# Patient Record
Sex: Female | Born: 1955 | Race: White | Hispanic: No | Marital: Married | Smoking: Never smoker
Health system: Southern US, Community
[De-identification: ages and names within clinical notes are randomized; demographics above are authoritative.]

## PROBLEM LIST (undated history)

## (undated) DIAGNOSIS — M1990 Unspecified osteoarthritis, unspecified site: Secondary | ICD-10-CM

## (undated) DIAGNOSIS — E119 Type 2 diabetes mellitus without complications: Secondary | ICD-10-CM

## (undated) DIAGNOSIS — E079 Disorder of thyroid, unspecified: Secondary | ICD-10-CM

## (undated) DIAGNOSIS — I10 Essential (primary) hypertension: Secondary | ICD-10-CM

## (undated) DIAGNOSIS — E7800 Pure hypercholesterolemia, unspecified: Secondary | ICD-10-CM

---

## 2015-07-02 ENCOUNTER — Emergency Department
Admission: EM | Admit: 2015-07-02 | Discharge: 2015-07-02 | Disposition: A | Discharge status: Home / Self Care | Payer: BC Managed Care – PPO | Attending: Emergency Medicine | Admitting: Emergency Medicine

## 2015-07-02 ENCOUNTER — Encounter: Payer: Self-pay | Admitting: Emergency Medicine

## 2015-07-02 ENCOUNTER — Emergency Department: Payer: BC Managed Care – PPO

## 2015-07-02 DIAGNOSIS — Y9289 Other specified places as the place of occurrence of the external cause: Secondary | ICD-10-CM | POA: Insufficient documentation

## 2015-07-02 DIAGNOSIS — G8929 Other chronic pain: Secondary | ICD-10-CM | POA: Insufficient documentation

## 2015-07-02 DIAGNOSIS — S8002XA Contusion of left knee, initial encounter: Secondary | ICD-10-CM | POA: Diagnosis not present

## 2015-07-02 DIAGNOSIS — Y9389 Activity, other specified: Secondary | ICD-10-CM | POA: Diagnosis not present

## 2015-07-02 DIAGNOSIS — S0990XA Unspecified injury of head, initial encounter: Secondary | ICD-10-CM | POA: Diagnosis not present

## 2015-07-02 DIAGNOSIS — E119 Type 2 diabetes mellitus without complications: Secondary | ICD-10-CM | POA: Diagnosis not present

## 2015-07-02 DIAGNOSIS — I10 Essential (primary) hypertension: Secondary | ICD-10-CM | POA: Insufficient documentation

## 2015-07-02 DIAGNOSIS — Y998 Other external cause status: Secondary | ICD-10-CM | POA: Diagnosis not present

## 2015-07-02 DIAGNOSIS — Z7982 Long term (current) use of aspirin: Secondary | ICD-10-CM | POA: Diagnosis not present

## 2015-07-02 DIAGNOSIS — Z79899 Other long term (current) drug therapy: Secondary | ICD-10-CM | POA: Diagnosis not present

## 2015-07-02 DIAGNOSIS — S80212A Abrasion, left knee, initial encounter: Secondary | ICD-10-CM | POA: Diagnosis not present

## 2015-07-02 DIAGNOSIS — T148XXA Other injury of unspecified body region, initial encounter: Secondary | ICD-10-CM

## 2015-07-02 DIAGNOSIS — Z7984 Long term (current) use of oral hypoglycemic drugs: Secondary | ICD-10-CM | POA: Diagnosis not present

## 2015-07-02 DIAGNOSIS — W01198A Fall on same level from slipping, tripping and stumbling with subsequent striking against other object, initial encounter: Secondary | ICD-10-CM | POA: Insufficient documentation

## 2015-07-02 HISTORY — DX: Type 2 diabetes mellitus without complications: E11.9

## 2015-07-02 HISTORY — DX: Pure hypercholesterolemia, unspecified: E78.00

## 2015-07-02 HISTORY — DX: Disorder of thyroid, unspecified: E07.9

## 2015-07-02 HISTORY — DX: Unspecified osteoarthritis, unspecified site: M19.90

## 2015-07-02 HISTORY — DX: Essential (primary) hypertension: I10

## 2015-07-02 NOTE — ED Notes (Signed)
States she tripped at the Vet's office   Hit head  And landed on left knee  Abrasion noted to left knee denies any LOC

## 2015-07-02 NOTE — Discharge Instructions (Signed)
Contusion A contusion is a deep bruise. Contusions are the result of a blunt injury to tissues and muscle fibers under the skin. The injury causes bleeding under the skin. The skin overlying the contusion may turn blue, purple, or yellow. Minor injuries will give you a painless contusion, but more severe contusions may stay painful and swollen for a few weeks.  CAUSES  This condition is usually caused by a blow, trauma, or direct force to an area of the body. SYMPTOMS  Symptoms of this condition include:  Swelling of the injured area.  Pain and tenderness in the injured area.  Discoloration. The area may have redness and then turn blue, purple, or yellow. DIAGNOSIS  This condition is diagnosed based on a physical exam and medical history. An X-ray, CT scan, or MRI may be needed to determine if there are any associated injuries, such as broken bones (fractures). TREATMENT  Specific treatment for this condition depends on what area of the body was injured. In general, the best treatment for a contusion is resting, icing, applying pressure to (compression), and elevating the injured area. This is often called the RICE strategy. Over-the-counter anti-inflammatory medicines may also be recommended for pain control.  HOME CARE INSTRUCTIONS   Rest the injured area.  If directed, apply ice to the injured area:  Put ice in a plastic bag.  Place a towel between your skin and the bag.  Leave the ice on for 20 minutes, 2-3 times per day.  If directed, apply light compression to the injured area using an elastic bandage. Make sure the bandage is not wrapped too tightly. Remove and reapply the bandage as directed by your health care provider.  If possible, raise (elevate) the injured area above the level of your heart while you are sitting or lying down.  Take over-the-counter and prescription medicines only as told by your health care provider. SEEK MEDICAL CARE IF:  Your symptoms do not  improve after several days of treatment.  Your symptoms get worse.  You have difficulty moving the injured area. SEEK IMMEDIATE MEDICAL CARE IF:   You have severe pain.  You have numbness in a hand or foot.  Your hand or foot turns pale or cold.   This information is not intended to replace advice given to you by your health care provider. Make sure you discuss any questions you have with your health care provider.   Document Released: 05/25/2005 Document Revised: 05/06/2015 Document Reviewed: 12/31/2014 Elsevier Interactive Patient Education 2016 [employer].  Concussion, Adult A concussion is a brain injury. It is caused by:  A hit to the head.  A quick and sudden movement (jolt) of the head or neck. A concussion is usually not life threatening. Even so, it can cause serious problems. If you had a concussion before, you may have concussion-like problems after a hit to your head. HOME CARE General Instructions  Follow your doctor's directions carefully.  Take medicines only as told by your doctor.  Only take medicines your doctor says are safe.  Do not drink alcohol until your doctor says it is okay. Alcohol and some drugs can slow down healing. They can also put you at risk for further injury.  If you are having trouble remembering things, write them down.  Try to do one thing at a time if you get distracted easily. For example, do not watch TV while making dinner.  Talk to your family members or close friends when making important decisions.  Follow up with your doctor as told.  Watch your symptoms. Tell others to do the same. Serious problems can sometimes happen after a concussion. Older adults are more likely to have these problems.  Tell your teachers, school nurse, school counselor, coach, Event organiser, or work Production designer, theatre/television/film about your concussion. Tell them about what you can or cannot do. They should watch to see if:  It gets even harder for you to pay  attention or concentrate.  It gets even harder for you to remember things or learn new things.  You need more time than normal to finish things.  You become annoyed (irritable) more than before.  You are not able to deal with stress as well.  You have more problems than before.  Rest. Make sure you:  Get plenty of sleep at night.  Go to sleep early.  Go to bed at the same time every day. Try to wake up at the same time.  Rest during the day.  Take naps when you feel tired.  Limit activities where you have to think a lot or concentrate. These include:  Doing homework.  Doing work related to a job.  Watching TV.  Using the computer. Returning To Your Regular Activities Return to your normal activities slowly, not all at once. You must give your body and brain enough time to heal.   Do not play sports or do other athletic activities until your doctor says it is okay.  Ask your doctor when you can drive, ride a bicycle, or work other vehicles or machines. Never do these things if you feel dizzy.  Ask your doctor about when you can return to work or school. Preventing Another Concussion It is very important to avoid another brain injury, especially before you have healed. In rare cases, another injury can lead to permanent brain damage, brain swelling, or death. The risk of this is greatest during the first 7-10 days after your injury. Avoid injuries by:   Wearing a seat belt when riding in a car.  Not drinking too much alcohol.  Avoiding activities that could lead to a second concussion (such as contact sports).  Wearing a helmet when doing activities like:  Biking.  Skiing.  Skateboarding.  Skating.  Making your home safer by:  Removing things from the floor or stairways that could make you trip.  Using grab bars in bathrooms and handrails by stairs.  Placing non-slip mats on floors and in bathtubs.  Improve lighting in dark areas. GET HELP IF:  It  gets even harder for you to pay attention or concentrate.  It gets even harder for you to remember things or learn new things.  You need more time than normal to finish things.  You become annoyed (irritable) more than before.  You are not able to deal with stress as well.  You have more problems than before.  You have problems keeping your balance.  You are not able to react quickly when you should. Get help if you have any of these problems for more than 2 weeks:   Lasting (chronic) headaches.  Dizziness or trouble balancing.  Feeling sick to your stomach (nausea).  Seeing (vision) problems.  Being affected by noises or light more than normal.  Feeling sad, low, down in the dumps, blue, gloomy, or empty (depressed).  Mood changes (mood swings).  Feeling of fear or nervousness about what may happen (anxiety).  Feeling annoyed.  Memory problems.  Problems concentrating or paying attention.  Sleep problems.  Feeling tired all the time. GET HELP RIGHT AWAY IF:   You have bad headaches or your headaches get worse.  You have weakness (even if it is in one hand, leg, or part of the face).  You have loss of feeling (numbness).  You feel off balance.  You keep throwing up (vomiting).  You feel tired.  One black center of your eye (pupil) is larger than the other.  You twitch or shake violently (convulse).  Your speech is not clear (slurred).  You are more confused, easily angered (agitated), or annoyed than before.  You have more trouble resting than before.  You are unable to recognize people or places.  You have neck pain.  It is difficult to wake you up.  You have unusual behavior changes.  You pass out (lose consciousness). MAKE SURE YOU:   Understand these instructions.  Will watch your condition.  Will get help right away if you are not doing well or get worse.   This information is not intended to replace advice given to you by your  health care provider. Make sure you discuss any questions you have with your health care provider.   Document Released: 08/03/2009 Document Revised: 09/05/2014 Document Reviewed: 03/07/2013 Elsevier Interactive Patient Education 2016 [employer]  Head Injury, Adult You have a head injury. Headaches and throwing up (vomiting) are common after a head injury. It should be easy to wake up from sleeping. Sometimes you must stay in the hospital. Most problems happen within the first 24 hours. Side effects may occur up to 7-10 days after the injury.  WHAT ARE THE TYPES OF HEAD INJURIES? Head injuries can be as minor as a bump. Some head injuries can be more severe. More severe head injuries include:  A jarring injury to the brain (concussion).  A bruise of the brain (contusion). This mean there is bleeding in the brain that can cause swelling.  A cracked skull (skull fracture).  Bleeding in the brain that collects, clots, and forms a bump (hematoma). WHEN SHOULD I GET HELP RIGHT AWAY?   You are confused or sleepy.  You cannot be woken up.  You feel sick to your stomach (nauseous) or keep throwing up (vomiting).  Your dizziness or unsteadiness is getting worse.  You have very bad, lasting headaches that are not helped by medicine. Take medicines only as told by your doctor.  You cannot use your arms or legs like normal.  You cannot walk.  You notice changes in the black spots in the center of the colored part of your eye (pupil).  You have clear or bloody fluid coming from your nose or ears.  You have trouble seeing. During the next 24 hours after the injury, you must stay with someone who can watch you. This person should get help right away (call 911 in the [place] if you start to shake and are not able to control it (have seizures), you pass out, or you are unable to wake up. HOW CAN I PREVENT A HEAD INJURY IN THE FUTURE?  Wear seat belts.  Wear a helmet while bike riding and  playing sports like football.  Stay away from dangerous activities around the house. WHEN CAN I RETURN TO NORMAL ACTIVITIES AND ATHLETICS? See your doctor before doing these activities. You should not do normal activities or play contact sports until 1 week after the following symptoms have stopped:  Headache that does not go away.  Dizziness.  Poor  attention.  Confusion.  Memory problems.  Sickness to your stomach or throwing up.  Tiredness.  Fussiness.  Bothered by bright lights or loud noises.  Anxiousness or depression.  Restless sleep. MAKE SURE YOU:   Understand these instructions.  Will watch your condition.  Will get help right away if you are not doing well or get worse.   This information is not intended to replace advice given to you by your health care provider. Make sure you discuss any questions you have with your health care provider.   Document Released: 07/28/2008 Document Revised: 09/05/2014 Document Reviewed: 04/22/2013 Elsevier Interactive Patient Education [employer]   He may use tramadol as needed for pain. Continue ice to the left knee and scalp. Follow-up with a local physician as needed. Return to the emergency room for any concerns. You  may also see an orthopedist for further knee concerns.

## 2015-07-02 NOTE — ED Notes (Signed)
Brought in via ems s/p fall hit head and has a abrasion to left knee

## 2015-07-02 NOTE — ED Provider Notes (Signed)
[facility] Emergency Department Provider Note  ____________________________________________  Time seen: Approximately 5:34 PM  I have reviewed the triage vital signs and the nursing notes.   HISTORY  Chief Complaint Fall    HPI Tamara Bentley is a 59 y.o. female with history of hypertension, diabetes him a hyperlipidemia, hypothyroidism who reports tripping on a step and falling while at the veterinarian today. She hit her knee on the pavement and her head on the building. She denies loss of consciousness, altered mental status or dizziness. She denies vision changes. She was evaluated by EMS and brought to the emergency room. She has pain to the left knee and an abrasion. She has a history of arthritis of the knees. She denies neck pain shoulder pain or injury to the arms. No additional injuries to the back or pelvis.   Past Medical History  Diagnosis Date  . Diabetes mellitus without complication (HCC)   . Hypertension   . Thyroid disease   . Arthritis   . Hypercholesteremia     There are no active problems to display for this patient.   No past surgical history on file.  Current Outpatient Rx  Name  Route  Sig  Dispense  Refill  . aspirin EC 81 MG tablet   Oral   Take 81 mg by mouth daily.         Marland Kitchen atorvastatin (LIPITOR) 80 MG tablet   Oral   Take 80 mg by mouth daily.         Marland Kitchen levothyroxine (SYNTHROID, LEVOTHROID) 150 MCG tablet   Oral   Take 150 mcg by mouth daily before breakfast.         . lisinopril (PRINIVIL,ZESTRIL) 40 MG tablet   Oral   Take 40 mg by mouth daily.         . metFORMIN (GLUCOPHAGE) 1000 MG tablet   Oral   Take 1,000 mg by mouth 2 (two) times daily with a meal.         . metoprolol tartrate (LOPRESSOR) 25 MG tablet   Oral   Take 25 mg by mouth 2 (two) times daily.         . sitaGLIPtin (JANUVIA) 100 MG tablet   Oral   Take 100 mg by mouth daily.           Allergies Review of patient's  allergies indicates no known allergies.  No family history on file.  Social History Social History  Substance Use Topics  . Smoking status: Never Smoker   . Smokeless tobacco: Not on file  . Alcohol Use: No    Review of Systems Constitutional: No fever/chills Eyes: No visual changes. ENT: No sore throat. Cardiovascular: Denies chest pain. Respiratory: Denies shortness of breath. Gastrointestinal: No abdominal pain.   Musculoskeletal: hx of chronic low back pain. Skin: Negative for rash. Neurological: Negative for focal weakness or numbness.  10-point ROS otherwise negative.  ____________________________________________   PHYSICAL EXAM:  VITAL SIGNS: ED Triage Vitals  Enc Vitals Group     BP --      Pulse --      Resp --      Temp --      Temp src --      SpO2 --      Weight --      Height --      Head Cir --      Peak Flow --      Pain Score --  Pain Loc --      Pain Edu? --      Excl. in GC? --     Constitutional: Alert and oriented. Well appearing and in no acute distress. Eyes: Conjunctivae are normal. PERRL. EOMI. Head: Atraumatic. Nose: No congestion/rhinnorhea. Mouth/Throat: Mucous membranes are moist.  Oropharynx non-erythematous. Neck: No stridor. Supple.  No cervical spine tenderness to palpation.  Cardiovascular: Normal rate, regular rhythm. Grossly normal heart sounds.  Good peripheral circulation. Respiratory: Normal respiratory effort.  No retractions. Lungs CTAB. Gastrointestinal: Soft and nontender. No distention. No abdominal bruits. No CVA tenderness. Musculoskeletal: abrasion to the left anterior knee;  Pain with mild palpation.  ROM intact. Minimal joint line tenderness.  Stable joint without laxity. Neurologic:  Normal speech and language. No gross focal neurologic deficits are appreciated. No gait instability. Cranial nerves II through XII grossly intact. Negative Romberg, normal cerebellar function Skin:  Skin is warm, dry and  intact. No rash noted. Psychiatric: Mood and affect are normal. Speech and behavior are normal.  ____________________________________________   LABS (all labs ordered are listed, but only abnormal results are displayed)  Labs Reviewed - No data to display ____________________________________________  EKG   ____________________________________________  RADIOLOGY      CLINICAL DATA: Patient fell at 4:30 this afternoon. Struck head on brick side of building before landing on the left knee. Pain in anterior and lateral left knee joint.  EXAM: LEFT KNEE - COMPLETE 4+ VIEW  COMPARISON: None.  FINDINGS: Mild degenerative changes in the left knee with mild medial compartment narrowing and small tricompartment osteophytosis. No significant effusion. No evidence of acute fracture or subluxation. No focal bone lesion or bone destruction. Bone cortex and trabecular architecture appear intact. No radiopaque soft tissue foreign bodies.  IMPRESSION: Mild degenerative changes of the left knee. No acute bony abnormalities.   Electronically Signed  By: Burman Nieves M.D.  On: 07/02/2015 18:33   CLINICAL DATA: States she tripped at the Vet's office Hit head And landed on left knee Abrasion noted to left knee denies any LOC  EXAM: CT HEAD WITHOUT CONTRAST  TECHNIQUE: Contiguous axial images were obtained from the base of the skull through the vertex without intravenous contrast.  COMPARISON: None.  FINDINGS: There is no intra or extra-axial fluid collection or mass lesion. The basilar cisterns and ventricles have a normal appearance. There is no CT evidence for acute infarction or hemorrhage. Bone windows demonstrate hyperostosis frontalis. Paranasal and mastoid air cells are normally aerated. No calvarial fracture.  IMPRESSION: No evidence for acute intracranial abnormality.   Electronically Signed  By: Norva Pavlov M.D.  On:  07/02/2015 18:33    ____________________________________________   PROCEDURES  Procedure(s) performed: None  Critical Care performed: No  ____________________________________________   INITIAL IMPRESSION / ASSESSMENT AND PLAN / ED COURSE  Pertinent labs & imaging results that were available during my care of the patient were reviewed by me and considered in my medical decision making (see chart for details).  59 year old female who suffered a fall hitting her left knee and left scalp. Normal CT of the head and x-ray of the left knee. She is treated for contusions and encouraged to follow-up with a local physician. She is given the name of a orthopedist if needed. She has tramadol as needed for pain. She will return to the emergency room for any concerns. ____________________________________________   FINAL CLINICAL IMPRESSION(S) / ED DIAGNOSES  Final diagnoses:  Head injury, initial encounter  Knee contusion, left, initial encounter  Abrasion  Ignacia Bayleyobert Stpehen Petitjean, PA-C 07/02/15 1845  Governor Rooksebecca Lord, MD 07/03/15 [id number]

## 2016-05-03 ENCOUNTER — Ambulatory Visit: Payer: BC Managed Care – PPO | Admitting: Internal Medicine

## 2016-05-03 ENCOUNTER — Telehealth: Payer: Self-pay | Admitting: *Deleted

## 2016-05-03 DIAGNOSIS — Z0289 Encounter for other administrative examinations: Secondary | ICD-10-CM

## 2016-05-03 NOTE — Telephone Encounter (Signed)
Patient did not come in for their appointment today for ingrown toenail Please let me know if patient needs to be contacted immediately for follow up or no follow up needed.

## 2016-05-03 NOTE — Telephone Encounter (Signed)
No follow up appt. needed

## 2016-05-25 ENCOUNTER — Ambulatory Visit: Payer: BC Managed Care – PPO | Admitting: Internal Medicine

## 2016-07-29 ENCOUNTER — Ambulatory Visit: Payer: BC Managed Care – PPO | Admitting: Dietician

## 2016-08-01 ENCOUNTER — Encounter: Payer: Self-pay | Admitting: Dietician

## 2017-01-29 IMAGING — CT CT HEAD W/O CM
3 series · 17 of 30 positions shown, 19 images · non-contrast
Comparison: None.

CLINICAL DATA: States she tripped at the Vet's office Hit head And
landed on left knee Abrasion noted to left knee denies any LOC

EXAM:
CT HEAD WITHOUT CONTRAST
TECHNIQUE: Contiguous axial images were obtained from the base of the skull
through the vertex without intravenous contrast.

[Series 2: head bone · axial · 0.43mm/px · z∈[+272,+418]mm · 8 of 85 slices shown]
[im 6/85  bone]
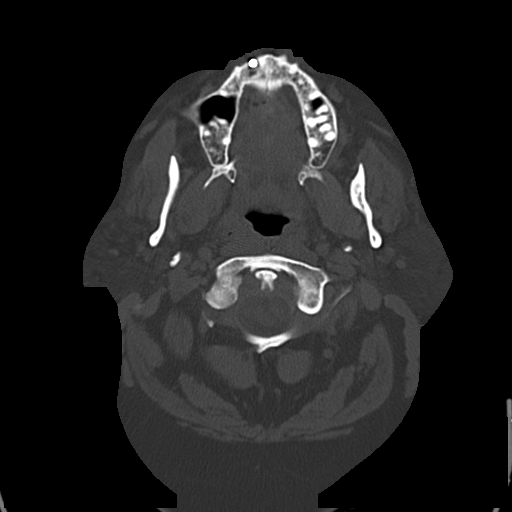
[im 16/85  bone]
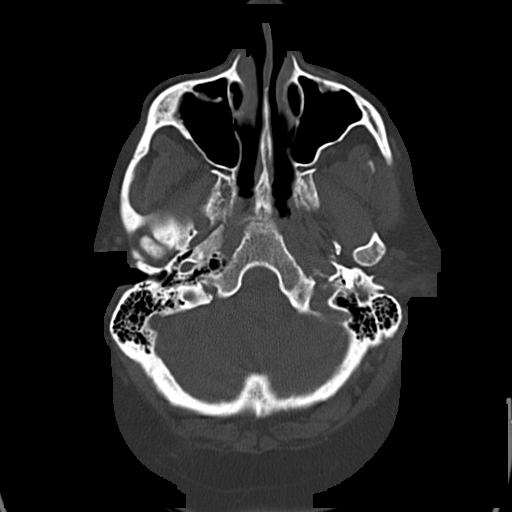
[im 27/85  bone]
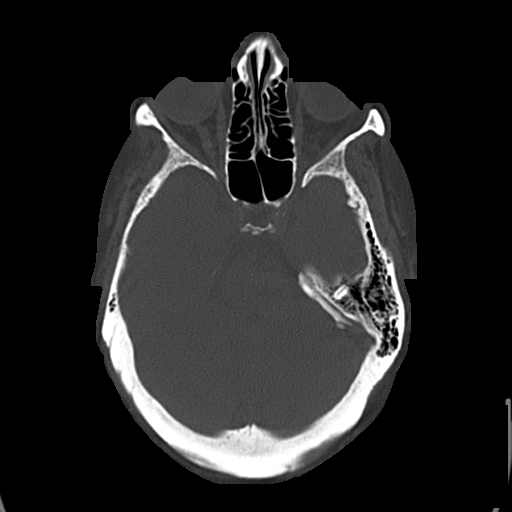
[im 37/85  bone]
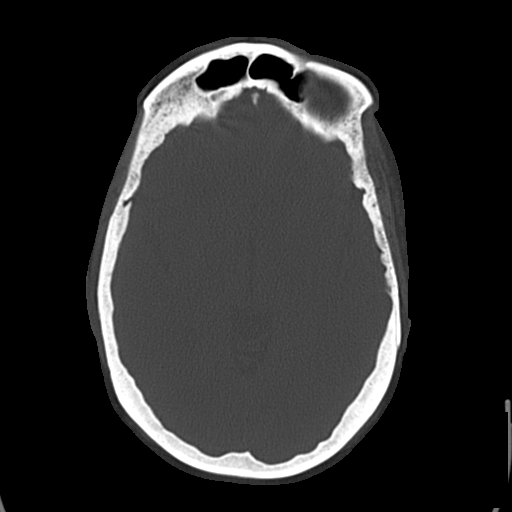
[im 48/85  bone]
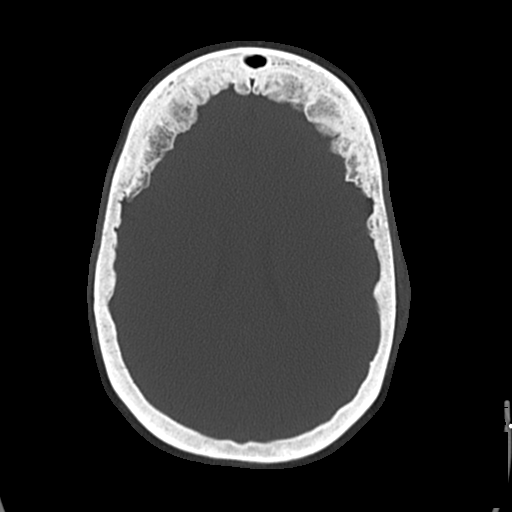
[im 58/85  bone]
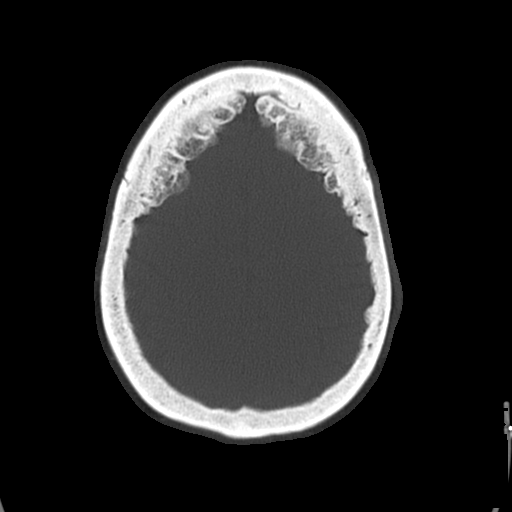
[im 69/85  bone]
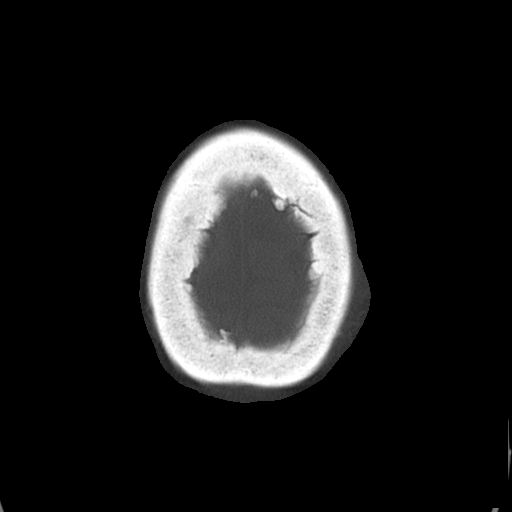
[im 79/85  bone]
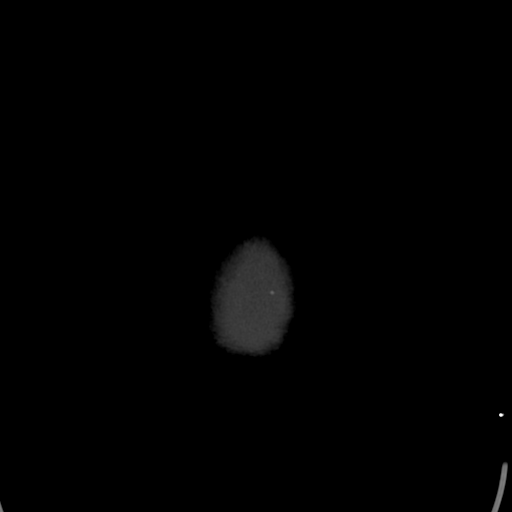

[Series 3: head wo · axial · 0.43mm/px · z∈[+296,+396]mm · 5 of 31 slices shown, 7 images]
[im 6/31  brain]
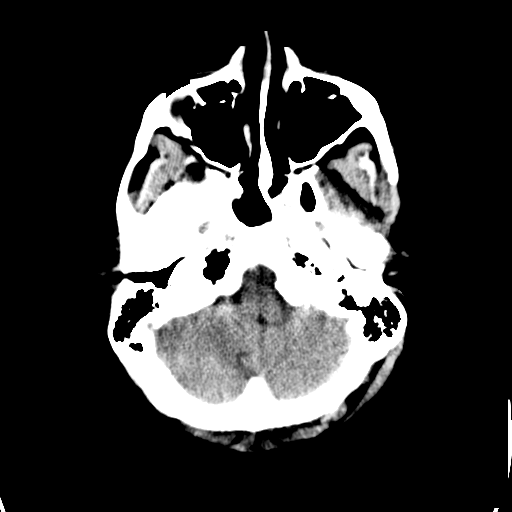
[im 6/31  bone]
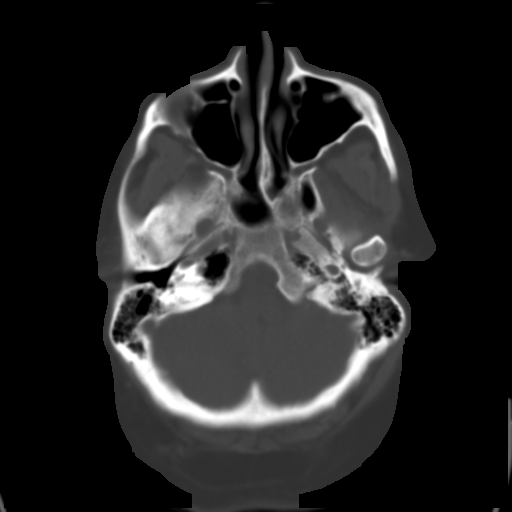
[im 11/31  brain]
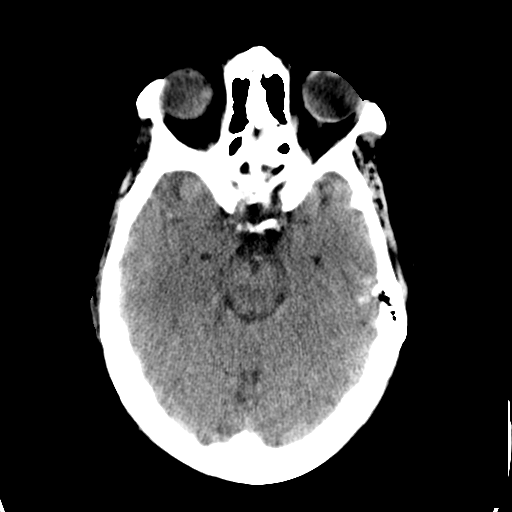
[im 16/31  brain]
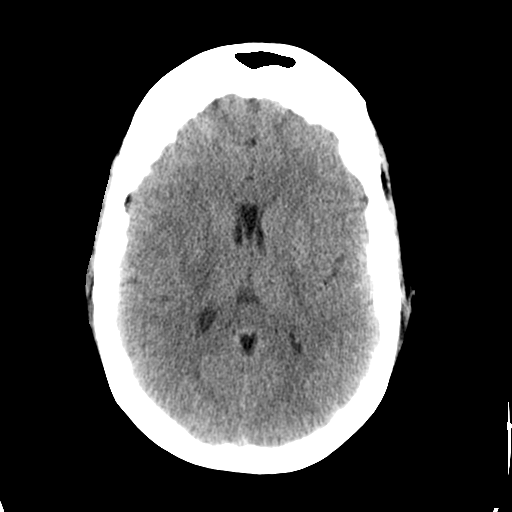
[im 21/31  brain]
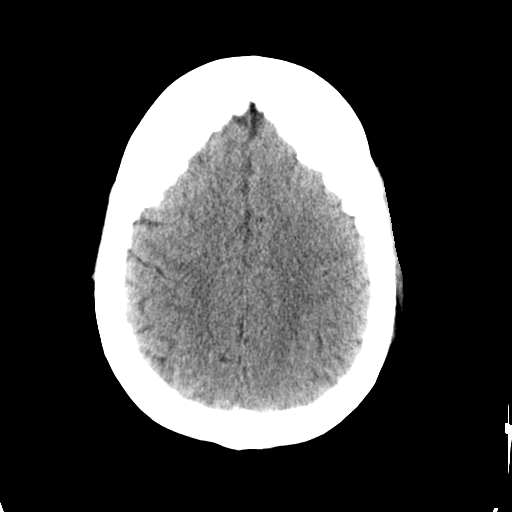
[im 26/31  brain]
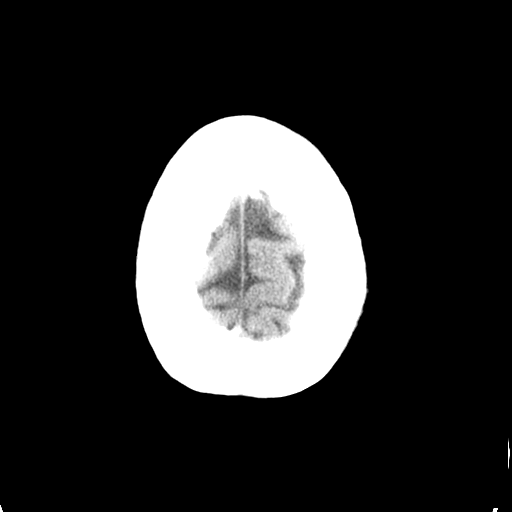
[im 26/31  bone]
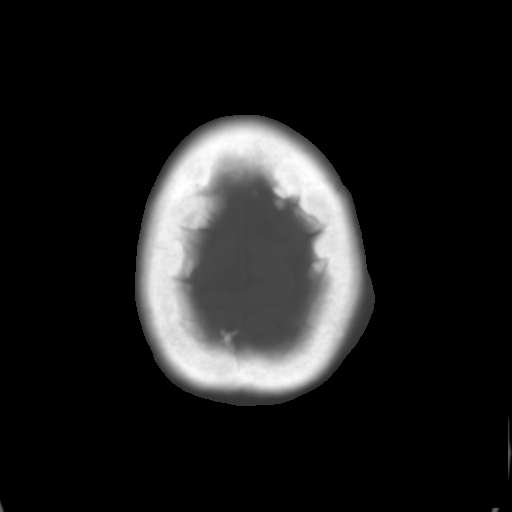

[Series 4: head wo recon · axial · 0.43mm/px · z∈[+355,+426]mm · 4 of 27 slices shown]
[im 6/27  brain]
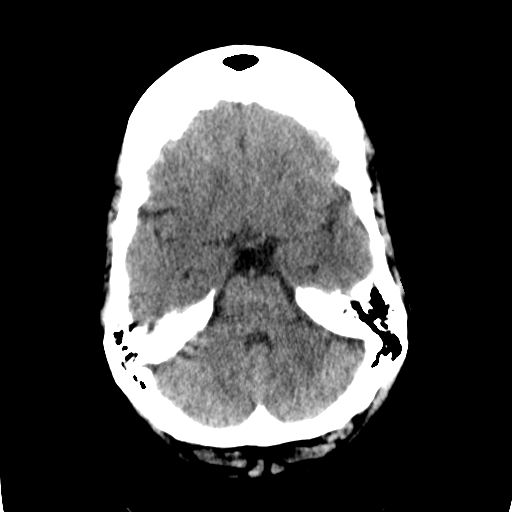
[im 11/27  brain]
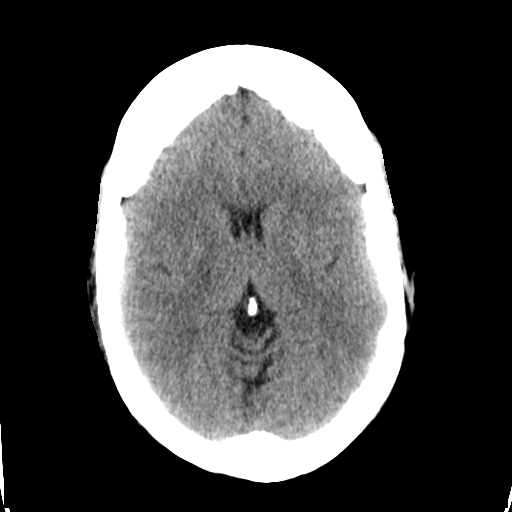
[im 16/27  brain]
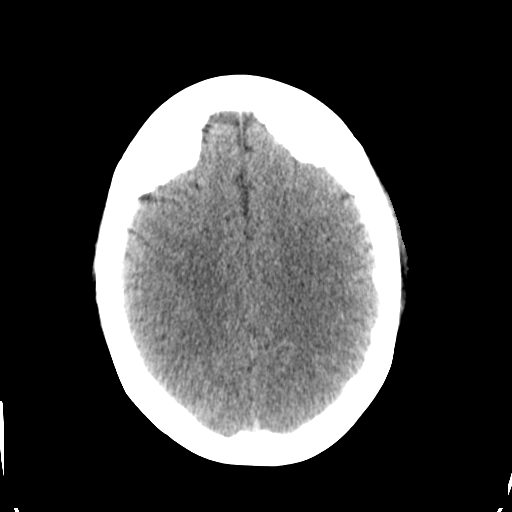
[im 21/27  brain]
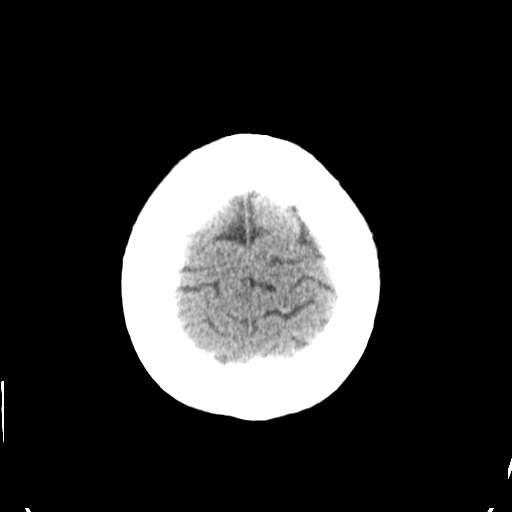

[17 of 30 positions shown; findings below may reference images not displayed]

FINDINGS: There is no intra or extra-axial fluid collection or mass lesion.
The basilar cisterns and ventricles have a normal appearance. There
is no CT evidence for acute infarction or hemorrhage. Bone windows
demonstrate hyperostosis frontalis. Paranasal and mastoid air cells
are normally aerated. No calvarial fracture.
IMPRESSION: No evidence for acute intracranial abnormality.
